# Patient Record
Sex: Male | Born: 2000 | Race: Black or African American | Hispanic: No | Marital: Single | Smoking: Never smoker
Health system: Southern US, Community
[De-identification: ages and names within clinical notes are randomized; demographics above are authoritative.]

## PROBLEM LIST (undated history)

## (undated) DIAGNOSIS — T7840XA Allergy, unspecified, initial encounter: Secondary | ICD-10-CM

## (undated) DIAGNOSIS — J45909 Unspecified asthma, uncomplicated: Secondary | ICD-10-CM

## (undated) HISTORY — DX: Unspecified asthma, uncomplicated: J45.909

## (undated) HISTORY — DX: Allergy, unspecified, initial encounter: T78.40XA

---
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
## 2020-12-14 ENCOUNTER — Other Ambulatory Visit: Payer: Self-pay

## 2020-12-14 ENCOUNTER — Encounter: Payer: Self-pay | Admitting: Family Medicine

## 2020-12-14 ENCOUNTER — Ambulatory Visit: Payer: Commercial Managed Care - PPO | Admitting: Family Medicine

## 2020-12-14 VITALS — BP 122/64 | HR 84 | Ht 67.0 in | Wt 125.0 lb

## 2020-12-14 DIAGNOSIS — Z7689 Persons encountering health services in other specified circumstances: Secondary | ICD-10-CM | POA: Diagnosis not present

## 2020-12-14 DIAGNOSIS — F5101 Primary insomnia: Secondary | ICD-10-CM

## 2020-12-14 MED ORDER — HYDROXYZINE HCL 10 MG PO TABS: 10.0000 mg | ORAL_TABLET | Freq: Every evening | ORAL | 11 refills | Status: DC | PRN

## 2020-12-14 NOTE — Progress Notes (Signed)
Date:  12/14/2020   Name:  Evan Webb   DOB:  [birth year]   MRN:  [id number]   Chief Complaint: Establish Care and Insomnia (Can't sleep at night- tried melatonin- helps some but "not consistently")  Patient is a 20 year old male who presents for a establish care exam. The patient reports the following problems: insomnia. Health maintenance has been reviewed up to date.    Insomnia Primary symptoms: difficulty falling asleep.   The current episode started one month. The onset quality is gradual. The problem occurs intermittently. The problem has been rapidly improving since onset. The symptoms are relieved by medication.   No results found for: CREATININE, BUN, NA, K, CL, CO2 No results found for: CHOL, HDL, LDLCALC, LDLDIRECT, TRIG, CHOLHDL No results found for: TSH No results found for: HGBA1C No results found for: WBC, HGB, HCT, MCV, PLT No results found for: ALT, AST, GGT, ALKPHOS, BILITOT   Review of Systems  Constitutional:  Negative for chills and fever.  HENT:  Negative for drooling, ear discharge, ear pain and sore throat.   Respiratory:  Negative for cough, shortness of breath and wheezing.   Cardiovascular:  Negative for chest pain, palpitations and leg swelling.  Gastrointestinal:  Negative for abdominal pain, blood in stool, constipation, diarrhea and nausea.  Endocrine: Negative for polydipsia.  Genitourinary:  Negative for dysuria, frequency, hematuria and urgency.  Musculoskeletal:  Negative for back pain, myalgias and neck pain.  Skin:  Negative for rash.  Allergic/Immunologic: Negative for environmental allergies.  Neurological:  Negative for dizziness and headaches.  Hematological:  Does not bruise/bleed easily.  Psychiatric/Behavioral:  Negative for suicidal ideas. The patient has insomnia. The patient is not nervous/anxious.    There are no problems to display for this patient.   No Known Allergies  History reviewed. No pertinent surgical  history.  Social History   Tobacco Use   Smoking status: Never   Smokeless tobacco: Never  Vaping Use   Vaping Use: Never used  Substance Use Topics   Alcohol use: Not Currently   Drug use: Never     Medication list has been reviewed and updated.  No outpatient medications have been marked as taking for the 12/14/20 encounter (Office Visit) with Duanne Limerick, MD.    [facility] 2/9 Scores 12/14/2020  PHQ - 2 Score 1  PHQ- 9 Score 4    GAD 7 : Generalized Anxiety Score 12/14/2020  Nervous, Anxious, on Edge 0  Control/stop worrying 1  Worry too much - different things 0  Trouble relaxing 0  Restless 0  Easily annoyed or irritable 2  Afraid - awful might happen 1  Total GAD 7 Score 4  Anxiety Difficulty Somewhat difficult    BP Readings from Last 3 Encounters:  12/14/20 122/64    Physical Exam Vitals reviewed.  HENT:     Head: Normocephalic.     Right Ear: External ear normal.     Left Ear: External ear normal.     Nose: Nose normal.  Eyes:     General: No scleral icterus.       Right eye: No discharge.        Left eye: No discharge.     Conjunctiva/sclera: Conjunctivae normal.     Pupils: Pupils are equal, round, and reactive to light.  Neck:     Thyroid: No thyromegaly.     Vascular: No JVD.     Trachea: No tracheal deviation.  Cardiovascular:  Rate and Rhythm: Normal rate and regular rhythm.     Heart sounds: Normal heart sounds. No murmur heard.   No friction rub. No gallop.  Pulmonary:     Effort: No respiratory distress.     Breath sounds: Normal breath sounds. No wheezing or rales.  Abdominal:     General: Bowel sounds are normal.     Palpations: Abdomen is soft. There is no mass.     Tenderness: There is no abdominal tenderness. There is no guarding or rebound.  Musculoskeletal:        General: No tenderness. Normal range of motion.     Cervical back: Normal range of motion and neck supple.  Lymphadenopathy:     Cervical: No cervical  adenopathy.  Skin:    General: Skin is warm.     Findings: No rash.  Neurological:     Mental Status: He is alert and oriented to person, place, and time.     Cranial Nerves: No cranial nerve deficit.     Deep Tendon Reflexes: Reflexes are normal and symmetric.    Wt Readings from Last 3 Encounters:  12/14/20 125 lb (56.7 kg) (7 %, Z= -1.50)*   * Growth percentiles are based on CDC (Boys, 2-20 Years) data.    BP 122/64   Pulse 84   Ht 5\' 7"  (1.702 m)   Wt 125 lb (56.7 kg)   BMI 19.58 kg/m   Assessment and Plan:  1. Primary insomnia Chronic.  Uncontrolled.  Stable.  Patient has a history of primary insomnia for which she has been taking over-the-counter melatonin with mixed results.  Patient is a at [employer] and we want to avoid medications that could have lingering cognitive concerns.  We will go with low-dose hydroxyzine since he does have a history of allergic rhinitis allergic dermatitis and allergic conjunctivitis.  We will start with 1/2 to 1 tablet of a 10 mg nightly and will therefore. - hydrOXYzine (ATARAX/VISTARIL) 10 MG tablet; Take 1 tablet (10 mg total) by mouth at bedtime as needed.  Dispense: 30 tablet; Refill: 11  2. Establishing care with new doctor, encounter for Patient establishing care with new physician.

## 2020-12-14 NOTE — Patient Instructions (Signed)
Insomnia Insomnia is a sleep disorder that makes it difficult to fall asleep or stay asleep. Insomnia can cause fatigue, low energy, difficulty concentrating, moodswings, and poor performance at work or school. There are three different ways to classify insomnia: Difficulty falling asleep. Difficulty staying asleep. Waking up too early in the morning. Any type of insomnia can be long-term (chronic) or short-term (acute). Both are common. Short-term insomnia usually lasts for three months or less. Chronic insomnia occurs at least three times a week for longer than threemonths. What are the causes? Insomnia may be caused by another condition, situation, or substance, such as: Anxiety. Certain medicines. Gastroesophageal reflux disease (GERD) or other gastrointestinal conditions. Asthma or other breathing conditions. Restless legs syndrome, sleep apnea, or other sleep disorders. Chronic pain. Menopause. Stroke. Abuse of alcohol, tobacco, or illegal drugs. Mental health conditions, such as depression. Caffeine. Neurological disorders, such as Alzheimer's disease. An overactive thyroid (hyperthyroidism). Sometimes, the cause of insomnia may not be known. What increases the risk? Risk factors for insomnia include: Gender. Women are affected more often than men. Age. Insomnia is more common as you get older. Stress. Lack of exercise. Irregular work schedule or working night shifts. Traveling between different time zones. Certain medical and mental health conditions. What are the signs or symptoms? If you have insomnia, the main symptom is having trouble falling asleep or having trouble staying asleep. This may lead to other symptoms, such as: Feeling fatigued or having low energy. Feeling nervous about going to sleep. Not feeling rested in the morning. Having trouble concentrating. Feeling irritable, anxious, or depressed. How is this diagnosed? This condition may be diagnosed based  on: Your symptoms and medical history. Your health care provider may ask about: Your sleep habits. Any medical conditions you have. Your mental health. A physical exam. How is this treated? Treatment for insomnia depends on the cause. Treatment may focus on treating an underlying condition that is causing insomnia. Treatment may also include: Medicines to help you sleep. Counseling or therapy. Lifestyle adjustments to help you sleep better. Follow these instructions at home: Eating and drinking  Limit or avoid alcohol, caffeinated beverages, and cigarettes, especially close to bedtime. These can disrupt your sleep. Do not eat a large meal or eat spicy foods right before bedtime. This can lead to digestive discomfort that can make it hard for you to sleep.  Sleep habits  Keep a sleep diary to help you and your health care provider figure out what could be causing your insomnia. Write down: When you sleep. When you wake up during the night. How well you sleep. How rested you feel the next day. Any side effects of medicines you are taking. What you eat and drink. Make your bedroom a dark, comfortable place where it is easy to fall asleep. Put up shades or blackout curtains to block light from outside. Use a white noise machine to block noise. Keep the temperature cool. Limit screen use before bedtime. This includes: Watching TV. Using your smartphone, tablet, or computer. Stick to a routine that includes going to bed and waking up at the same times every day and night. This can help you fall asleep faster. Consider making a quiet activity, such as reading, part of your nighttime routine. Try to avoid taking naps during the day so that you sleep better at night. Get out of bed if you are still awake after 15 minutes of trying to sleep. Keep the lights down, but try reading or doing a quiet   activity. When you feel sleepy, go back to bed.  General instructions Take over-the-counter  and prescription medicines only as told by your health care provider. Exercise regularly, as told by your health care provider. Avoid exercise starting several hours before bedtime. Use relaxation techniques to manage stress. Ask your health care provider to suggest some techniques that may work well for you. These may include: Breathing exercises. Routines to release muscle tension. Visualizing peaceful scenes. Make sure that you drive carefully. Avoid driving if you feel very sleepy. Keep all follow-up visits as told by your health care provider. This is important. Contact a health care provider if: You are tired throughout the day. You have trouble in your daily routine due to sleepiness. You continue to have sleep problems, or your sleep problems get worse. Get help right away if: You have serious thoughts about hurting yourself or someone else. If you ever feel like you may hurt yourself or others, or have thoughts about taking your own life, get help right away. You can go to your nearest emergency department or call: Your local emergency services (911 in the [place] A suicide crisis helpline, such as the National Suicide Prevention Lifeline at 1-800-273-8255. This is open 24 hours a day. Summary Insomnia is a sleep disorder that makes it difficult to fall asleep or stay asleep. Insomnia can be long-term (chronic) or short-term (acute). Treatment for insomnia depends on the cause. Treatment may focus on treating an underlying condition that is causing insomnia. Keep a sleep diary to help you and your health care provider figure out what could be causing your insomnia. This information is not intended to replace advice given to you by your health care provider. Make sure you discuss any questions you have with your healthcare provider. Document Revised: 04/27/2020 Document Reviewed: 04/27/2020 Elsevier Patient Education  2022 [employer]  

## 2021-12-26 ENCOUNTER — Encounter: Payer: Self-pay | Admitting: Family Medicine

## 2021-12-26 ENCOUNTER — Ambulatory Visit (INDEPENDENT_AMBULATORY_CARE_PROVIDER_SITE_OTHER): Payer: Commercial Managed Care - PPO | Admitting: Family Medicine

## 2021-12-26 VITALS — BP 120/80 | HR 64 | Ht 67.0 in | Wt 140.0 lb

## 2021-12-26 DIAGNOSIS — F419 Anxiety disorder, unspecified: Secondary | ICD-10-CM | POA: Diagnosis not present

## 2021-12-26 DIAGNOSIS — F32A Depression, unspecified: Secondary | ICD-10-CM | POA: Diagnosis not present

## 2021-12-26 DIAGNOSIS — Z0000 Encounter for general adult medical examination without abnormal findings: Secondary | ICD-10-CM

## 2021-12-26 NOTE — Progress Notes (Signed)
Date:  12/26/2021   Name:  Evan Webb   DOB:  [birth year]   MRN:  [id number]   Chief Complaint: Annual Exam and Anxiety  Patient is a 21 year old male who presents for a comprehensive physical exam. The patient reports the following problems: anxiety. Health maintenance has been reviewed up to date.    Anxiety Patient reports no chest pain, dizziness, nausea, nervous/anxious behavior, palpitations, shortness of breath or suicidal ideas.      No results found for: "NA", "K", "CO2", "GLUCOSE", "BUN", "CREATININE", "CALCIUM", "EGFR", "GFRNONAA" No results found for: "CHOL", "HDL", "LDLCALC", "LDLDIRECT", "TRIG", "CHOLHDL" No results found for: "TSH" No results found for: "HGBA1C" No results found for: "WBC", "HGB", "HCT", "MCV", "PLT" No results found for: "ALT", "AST", "GGT", "ALKPHOS", "BILITOT" No results found for: "25OHVITD2", "25OHVITD3", "VD25OH"   Review of Systems  Constitutional:  Negative for chills and fever.  HENT:  Negative for drooling, ear discharge, ear pain and sore throat.   Respiratory:  Negative for cough, shortness of breath and wheezing.   Cardiovascular:  Negative for chest pain, palpitations and leg swelling.  Gastrointestinal:  Negative for abdominal pain, blood in stool, constipation, diarrhea and nausea.  Endocrine: Negative for polydipsia.  Genitourinary:  Negative for dysuria, frequency, hematuria and urgency.  Musculoskeletal:  Negative for back pain, myalgias and neck pain.  Skin:  Negative for rash.  Allergic/Immunologic: Negative for environmental allergies.  Neurological:  Negative for dizziness and headaches.  Hematological:  Does not bruise/bleed easily.  Psychiatric/Behavioral:  Negative for self-injury and suicidal ideas. The patient is not nervous/anxious.        Anxiety    There are no problems to display for this patient.   No Known Allergies  No past surgical history on file.  Social History   Tobacco Use   Smoking status:  Never   Smokeless tobacco: Never  Vaping Use   Vaping Use: Never used  Substance Use Topics   Alcohol use: Not Currently   Drug use: Never     Medication list has been reviewed and updated.  Current Meds  Medication Sig   albuterol (VENTOLIN HFA) 108 (90 Base) MCG/ACT inhaler    fexofenadine (ALLEGRA) 180 MG tablet Take 1 tablet by mouth as needed. otc   mometasone (NASONEX) 50 MCG/ACT nasal spray otc       12/26/2021   11:14 AM 12/14/2020    2:51 PM  GAD 7 : Generalized Anxiety Score  Nervous, Anxious, on Edge 2 0  Control/stop worrying 2 1  Worry too much - different things 2 0  Trouble relaxing 3 0  Restless 2 0  Easily annoyed or irritable 1 2  Afraid - awful might happen 3 1  Total GAD 7 Score 15 4  Anxiety Difficulty Very difficult Somewhat difficult       12/26/2021   11:14 AM 12/14/2020    2:50 PM  Depression screen PHQ 2/9  Decreased Interest 1 0  Down, Depressed, Hopeless 3 1  PHQ - 2 Score 4 1  Altered sleeping 1 2  Tired, decreased energy 1 1  Change in appetite 0 0  Feeling bad or failure about yourself  0 0  Trouble concentrating 2 0  Moving slowly or fidgety/restless 1 0  Suicidal thoughts 1 0  PHQ-9 Score 10 4  Difficult doing work/chores Very difficult Not difficult at all    BP Readings from Last 3 Encounters:  12/26/21 120/80  12/14/20 122/64    Physical  Exam Vitals and nursing note reviewed.  HENT:     Head: Normocephalic.     Jaw: There is normal jaw occlusion.     Right Ear: Hearing, tympanic membrane, ear canal and external ear normal. There is no impacted cerumen.     Left Ear: Hearing, tympanic membrane, ear canal and external ear normal. There is no impacted cerumen.     Nose: Nose normal. No congestion or rhinorrhea.     Right Sinus: No maxillary sinus tenderness or frontal sinus tenderness.     Left Sinus: No frontal sinus tenderness.     Mouth/Throat:     Mouth: Mucous membranes are moist.     Tongue: No lesions. Tongue  does not deviate from midline.     Pharynx: Oropharynx is clear. Uvula midline.     Tonsils: No tonsillar exudate or tonsillar abscesses.  Eyes:     General: Lids are normal. Vision grossly intact. Gaze aligned appropriately. No scleral icterus.       Right eye: No discharge.        Left eye: No discharge.     Extraocular Movements: Extraocular movements intact.     Conjunctiva/sclera: Conjunctivae normal.     Pupils: Pupils are equal, round, and reactive to light.     Funduscopic exam:    Right eye: Red reflex present.        Left eye: Red reflex present. Neck:     Thyroid: No thyromegaly.     Vascular: Normal carotid pulses. No carotid bruit, hepatojugular reflux or JVD.     Trachea: Trachea and phonation normal. No tracheal deviation.  Cardiovascular:     Rate and Rhythm: Normal rate and regular rhythm.     Chest Wall: PMI is not displaced. No thrill.     Pulses: Normal pulses.          Carotid pulses are 2+ on the right side and 2+ on the left side.      Radial pulses are 2+ on the right side and 2+ on the left side.       Femoral pulses are 2+ on the right side and 2+ on the left side.      Popliteal pulses are 2+ on the right side and 2+ on the left side.       Dorsalis pedis pulses are 2+ on the right side and 2+ on the left side.       Posterior tibial pulses are 2+ on the right side and 2+ on the left side.     Heart sounds: Normal heart sounds, S1 normal and S2 normal. No murmur heard.    No systolic murmur is present.     No diastolic murmur is present.     No friction rub. No gallop. No S3 or S4 sounds.  Pulmonary:     Effort: No respiratory distress.     Breath sounds: Normal breath sounds. No decreased breath sounds, wheezing, rhonchi or rales.  Chest:     Chest wall: No tenderness.  Breasts:    Right: Normal.     Left: Normal.  Abdominal:     General: Bowel sounds are normal.     Palpations: Abdomen is soft. There is no hepatomegaly, splenomegaly or mass.      Tenderness: There is no abdominal tenderness. There is no right CVA tenderness, left CVA tenderness, guarding or rebound.  Genitourinary:    Pubic Area: No rash or pubic lice.      Penis: Normal  and circumcised.      Testes: Normal.        Right: Mass not present.        Left: Mass not present.  Musculoskeletal:        General: No tenderness. Normal range of motion.     Cervical back: Normal, normal range of motion and neck supple.     Thoracic back: Normal.     Lumbar back: Normal.     Right lower leg: No edema.     Left lower leg: No edema.  Lymphadenopathy:     Head:     Right side of head: No submental or submandibular adenopathy.     Left side of head: No submental or submandibular adenopathy.     Cervical: No cervical adenopathy.     Right cervical: No superficial, deep or posterior cervical adenopathy.    Left cervical: No superficial, deep or posterior cervical adenopathy.     Upper Body:     Right upper body: No supraclavicular, axillary or pectoral adenopathy.     Left upper body: No supraclavicular, axillary or pectoral adenopathy.     Lower Body: No right inguinal adenopathy. No left inguinal adenopathy.  Skin:    General: Skin is warm.     Findings: No rash.     Comments: Areas of hypopigmentation  Neurological:     Mental Status: He is alert and oriented to person, place, and time.     Cranial Nerves: Cranial nerves 2-12 are intact. No cranial nerve deficit.     Sensory: Sensation is intact.     Motor: Motor function is intact.     Deep Tendon Reflexes: Reflexes are normal and symmetric.     Wt Readings from Last 3 Encounters:  12/26/21 140 lb (63.5 kg)  12/14/20 125 lb (56.7 kg) (7 %, Z= -1.50)*   * Growth percentiles are based on CDC (Boys, 2-20 Years) data.    BP 120/80   Pulse 64   Ht 5' 7" (1.702 m)   Wt 140 lb (63.5 kg)   BMI 21.93 kg/m   Assessment and Plan:  Evan Webb is a 21 y.o. male who presents today for his Complete Annual Exam. He  feels well physically. He reports exercising some. He reports he is sleeping .fairly well.   Patient's chart was reviewed for most recent encounters most recent imaging most recent labs and Care Everywhere. 1. Annual physical exam Immunizations are reviewed and recommendations provided.   Age appropriate screening tests are discussed. Counseling given for risk factor reduction interventions.  No subjective/objective concerns other than noting anxiety and depression on history of present illness, review of systems, or physical exam.  2. Anxiety and depression Chronic.  Persistent.  Is followed by a therapist but there is some concern about needing to be on medication.  My preference given the age is to refer to psychiatry for evaluation and determination of therapeutic choice.  Patient is leaving for [place] so preferably this will need to be in order over the next 6 to 8 weeks.  Referral has been placed with psychiatry

## 2021-12-27 LAB — RENAL FUNCTION PANEL
Albumin: 4.4 g/dL (ref 4.1–5.2)
BUN/Creatinine Ratio: 20 (ref 9–20)
BUN: 21 mg/dL — ABNORMAL HIGH (ref 6–20)
CO2: 25 mmol/L (ref 20–29)
Calcium: 9.7 mg/dL (ref 8.7–10.2)
Chloride: 105 mmol/L (ref 96–106)
Creatinine, Ser: 1.06 mg/dL (ref 0.76–1.27)
Glucose: 83 mg/dL (ref 70–99)
Phosphorus: 4.3 mg/dL — ABNORMAL HIGH (ref 2.8–4.1)
Potassium: 4.3 mmol/L (ref 3.5–5.2)
Sodium: 143 mmol/L (ref 134–144)
eGFR: 103 mL/min/{1.73_m2} (ref >59)

## 2021-12-27 LAB — LIPID PANEL WITH LDL/HDL RATIO
Cholesterol, Total: 153 mg/dL (ref 100–199)
HDL: 49 mg/dL (ref >39)
LDL Chol Calc (NIH): 93 mg/dL (ref 0–99)
LDL/HDL Ratio: 1.9 ratio (ref 0.0–3.6)
Triglycerides: 53 mg/dL (ref 0–149)
VLDL Cholesterol Cal: 11 mg/dL (ref 5–40)

## 2022-12-31 ENCOUNTER — Encounter: Payer: Commercial Managed Care - PPO | Admitting: Family Medicine

## 2023-05-28 ENCOUNTER — Encounter: Payer: Self-pay | Admitting: Family Medicine

## 2023-06-12 ENCOUNTER — Encounter: Payer: Self-pay | Admitting: Family Medicine

## 2023-06-12 ENCOUNTER — Ambulatory Visit: Payer: Commercial Managed Care - PPO | Admitting: Family Medicine

## 2023-06-12 VITALS — BP 100/78 | HR 85 | Ht 67.0 in | Wt 140.0 lb

## 2023-06-12 DIAGNOSIS — N451 Epididymitis: Secondary | ICD-10-CM

## 2023-06-12 DIAGNOSIS — Z23 Encounter for immunization: Secondary | ICD-10-CM

## 2023-06-12 MED ORDER — DOXYCYCLINE HYCLATE 100 MG PO TABS: 100.0000 mg | ORAL_TABLET | Freq: Two times a day (BID) | ORAL | 0 refills | Status: DC

## 2023-06-12 NOTE — Progress Notes (Signed)
Date:  06/12/2023   Name:  Evan Webb   DOB:  05/04/2001   MRN:  [id number]   Chief Complaint: Testicle Pain (X1 week, dull aching pain, hurt when walking, has gotten better, no redness or swelling )  Testicle Pain The patient's primary symptoms include scrotal swelling and testicular pain. The patient's pertinent negatives include no genital injury, genital itching, genital lesions, pelvic pain, penile discharge or penile pain. This is a new problem. The current episode started 1 to 4 weeks ago (10 days). The problem occurs rarely. The problem has been gradually improving. The patient is experiencing no pain. Pertinent negatives include no abdominal pain, chest pain, chills, diarrhea, dysuria, fever, flank pain, frequency, hematuria, hesitancy or urgency. Associated symptoms comments: Earlier dyscharge.    Lab Results  Component Value Date   NA 143 12/26/2021   K 4.3 12/26/2021   CO2 25 12/26/2021   GLUCOSE 83 12/26/2021   BUN 21 (H) 12/26/2021   CREATININE 1.06 12/26/2021   CALCIUM 9.7 12/26/2021   EGFR 103 12/26/2021   Lab Results  Component Value Date   CHOL 153 12/26/2021   HDL 49 12/26/2021   LDLCALC 93 12/26/2021   TRIG 53 12/26/2021   No results found for: "TSH" No results found for: "HGBA1C" No results found for: "WBC", "HGB", "HCT", "MCV", "PLT" No results found for: "ALT", "AST", "GGT", "ALKPHOS", "BILITOT" No results found for: "25OHVITD2", "25OHVITD3", "VD25OH"   Review of Systems  Constitutional:  Negative for chills and fever.  Cardiovascular:  Negative for chest pain and palpitations.  Gastrointestinal:  Negative for abdominal pain and diarrhea.  Genitourinary:  Positive for scrotal swelling and testicular pain. Negative for difficulty urinating, dysuria, enuresis, flank pain, frequency, genital sores, hematuria, hesitancy, pelvic pain, penile discharge, penile pain, penile swelling and urgency.       No penile dicharge    There are no active problems  to display for this patient.   No Known Allergies  History reviewed. No pertinent surgical history.  Social History   Tobacco Use   Smoking status: Never   Smokeless tobacco: Never  Vaping Use   Vaping status: Never Used  Substance Use Topics   Alcohol use: Not Currently   Drug use: Never     Medication list has been reviewed and updated.  Current Meds  Medication Sig   albuterol (VENTOLIN HFA) 108 (90 Base) MCG/ACT inhaler    fexofenadine (ALLEGRA) 180 MG tablet Take 1 tablet by mouth as needed. otc   hydrocortisone cream 1 % Apply 1 Application topically 2 (two) times daily.   mometasone (NASONEX) 50 MCG/ACT nasal spray otc       06/12/2023    2:19 PM 12/26/2021   11:14 AM 12/14/2020    2:51 PM  GAD 7 : Generalized Anxiety Score  Nervous, Anxious, on Edge 0 2 0  Control/stop worrying 0 2 1  Worry too much - different things 0 2 0  Trouble relaxing 0 3 0  Restless 0 2 0  Easily annoyed or irritable 0 1 2  Afraid - awful might happen 0 3 1  Total GAD 7 Score 0 15 4  Anxiety Difficulty Not difficult at all Very difficult Somewhat difficult       06/12/2023    2:19 PM 12/26/2021   11:14 AM 12/14/2020    2:50 PM  Depression screen PHQ 2/9  Decreased Interest 0 1 0  Down, Depressed, Hopeless 0 3 1  PHQ - 2 Score 0  4 1  Altered sleeping 0 1 2  Tired, decreased energy 2 1 1   Change in appetite 1 0 0  Feeling bad or failure about yourself  0 0 0  Trouble concentrating 1 2 0  Moving slowly or fidgety/restless 0 1 0  Suicidal thoughts 0 1 0  PHQ-9 Score 4 10 4   Difficult doing work/chores Not difficult at all Very difficult Not difficult at all    BP Readings from Last 3 Encounters:  06/12/23 100/78  12/26/21 120/80  12/14/20 122/64    Physical Exam Vitals and nursing note reviewed.  HENT:     Head: Normocephalic.     Right Ear: Tympanic membrane and external ear normal.     Left Ear: Tympanic membrane and external ear normal.     Nose: Nose normal.   Eyes:     General: No scleral icterus.       Right eye: No discharge.        Left eye: No discharge.     Conjunctiva/sclera: Conjunctivae normal.     Pupils: Pupils are equal, round, and reactive to light.  Neck:     Thyroid: No thyromegaly.     Vascular: No JVD.     Trachea: No tracheal deviation.  Cardiovascular:     Rate and Rhythm: Normal rate and regular rhythm.     Heart sounds: Normal heart sounds. No murmur heard.    No friction rub. No gallop.  Pulmonary:     Effort: No respiratory distress.     Breath sounds: Normal breath sounds. No wheezing, rhonchi or rales.  Abdominal:     General: Bowel sounds are normal.     Palpations: Abdomen is soft. There is no mass.     Tenderness: There is no abdominal tenderness. There is no guarding or rebound.  Genitourinary:    Testes:        Right: Tenderness and swelling present. Mass, testicular hydrocele or varicocele not present.        Left: Mass, tenderness, swelling, testicular hydrocele or varicocele not present.     Comments: Tender and swelling R epidydimis Musculoskeletal:     Cervical back: Normal range of motion and neck supple.  Lymphadenopathy:     Cervical: No cervical adenopathy.  Neurological:     Mental Status: He is alert.     Deep Tendon Reflexes: Reflexes are normal and symmetric.     Wt Readings from Last 3 Encounters:  12/26/21 140 lb (63.5 kg)  12/14/20 125 lb (56.7 kg) (7%, Z= -1.50)*   * Growth percentiles are based on CDC (Boys, 2-20 Years) data.    BP 100/78   Pulse 85   Ht 5\' 7"  (1.702 m)   SpO2 100%   BMI 21.93 kg/m   Assessment and Plan:  1. Epididymitis, right (Primary) New onset.  Gradually improving but still persistent symptomatology and tenderness of the right epididymis.  This is consistent with a subacute resolving acute epididymitis and we will treat with doxycycline 100 mg twice a day.  Even though patient has been not sexually active I would prefer to go in this direction.   Patient is to return if continued symptomatology. - doxycycline (VIBRA-TABS) 100 MG tablet; Take 1 tablet (100 mg total) by mouth 2 (two) times daily.  Dispense: 20 tablet; Refill: 0  2. Need for influenza vaccination Discussed and administered - Flu vaccine trivalent PF, 6mos and older(Flulaval,Afluria,Fluarix,Fluzone)    Elizabeth Sauer, MD

## 2023-06-12 NOTE — Patient Instructions (Signed)
Epididymitis  Epididymitis is inflammation or swelling of the epididymis. This is caused by an infection. The epididymis is a cord-like structure that is located along the top and back part of the testicle. It collects and stores sperm from the testicle. This condition can also cause pain and swelling of the testicle and scrotum. Symptoms usually start suddenly (acute epididymitis). Sometimes epididymitis starts gradually and lasts for a while (chronic epididymitis). Chronic epididymitis may be harder to treat. What are the causes? In men ages 1-40, this condition is usually caused by a bacterial infection or a sexually transmitted infection (STI), such as gonorrhea or chlamydia. In men 33 and older, this condition is usually caused by bacteria from a urinary blockage or from abnormalities in the urinary system. These can result from: Having a tube placed into the bladder (urinary catheter). Having an enlarged or inflamed prostate gland. Having recently had urinary tract surgery. Having a problem with a backward flow of urine (retrograde). In men who have a condition that weakens the body's defense system (immune system), such as human immunodeficiency virus (HIV), this condition can be caused by: Other bacteria, including tuberculosis and syphilis. Viruses. Fungi. Sometimes this condition occurs without infection. This may happen because of trauma or repetitive activities such as sports. What increases the risk? You are more likely to develop this condition if you have: Unprotected sex with more than one partner. Anal sex. Had recent surgery. A urinary catheter. Urinary problems. A suppressed immune system. What are the signs or symptoms? This condition usually begins suddenly with chills, fever, and pain behind the scrotum and in the testicle. Other symptoms include: Swelling of the scrotum, testicle, or both. Pain when ejaculating or urinating. Pain in the back or  abdomen. Nausea. Itching and discharge from the penis. A frequent need to pass urine. Redness, increased warmth, and tenderness of the scrotum. How is this diagnosed? Your health care provider can diagnose this condition based on your symptoms and medical history. Your health care provider will also do a physical exam to check your scrotum and testicle for swelling, pain, and redness. You may also have other tests, including: Testing of discharge from the penis. Testing your urine for infections, such as STIs. Ultrasound to check for blood flow and inflammation. Your health care provider may test you for other STIs, including HIV. How is this treated? Treatment for this condition depends on the cause. If your condition is caused by a bacterial infection, oral antibiotic medicine may be prescribed. If the bacterial infection has spread to your blood, you may need to receive IV antibiotics. For both bacterial and nonbacterial epididymitis, you may be treated with: Rest. Elevation of the scrotum. Pain medicines. Anti-inflammatory medicines. Surgery may be needed if: You have pus buildup in the scrotum (abscess). You have epididymitis that has not responded to other treatments. Follow these instructions at home: Medicines Take over-the-counter and prescription medicines only as told by your health care provider. If you were prescribed an antibiotic medicine, take it as told by your health care provider. Do not stop taking the antibiotic even if your condition improves. Sexual activity If your epididymitis was caused by an STI, avoid sexual activity until your treatment is complete. Inform your sexual partner or partners if you test positive for an STI. They may need to be treated. Do not engage in sexual activity with your partner or partners until their treatment is completed. Managing pain and swelling  If directed, raise (elevate) your scrotum and apply ice.  To do this: Put ice in a  plastic bag. Place a small towel or pillow between your legs. Rest your scrotum on the pillow or towel. Place another towel between your skin and the plastic bag. Leave the ice on for 20 minutes, 2-3 times a day. Remove the ice if your skin turns bright red. This is very important. If you cannot feel pain, heat, or cold, you have a greater risk of damage to the area. Keep your scrotum elevated and supported while resting. Ask your health care provider if you should wear a scrotal support, such as a jockstrap. Wear it as told by your health care provider. Try taking a sitz bath to help with discomfort. This is a warm water bath that is taken while you are sitting down. The water should come up to your hips and should cover your buttocks. Do this 3-4 times per day or as told by your health care provider. General instructions Drink enough fluid to keep your urine pale yellow. Return to your normal activities as told by your health care provider. Ask your health care provider what activities are safe for you. Keep all follow-up visits. This is important. Contact a health care provider if: You have a fever. Your pain medicine is not helping. Your pain is getting worse. Your symptoms do not improve within 3 days. Summary Epididymitis is inflammation or swelling of the epididymis. This is caused by an infection. This condition can also cause pain and swelling of the testicle and scrotum. Treatment for this condition depends on the cause. If your condition is caused by a bacterial infection, oral antibiotic medicine may be prescribed. Inform your sexual partner or partners if you test positive for an STI. They may need to be treated. Do not engage in sexual activity with your partner or partners until their treatment is completed. Contact a health care provider if your symptoms do not improve within 3 days. This information is not intended to replace advice given to you by your health care provider.  Make sure you discuss any questions you have with your health care provider. Document Revised: 01/24/2021 Document Reviewed: 01/24/2021 Elsevier Patient Education  2024 [employer].

## 2023-09-08 ENCOUNTER — Encounter: Payer: Self-pay | Admitting: Family Medicine

## 2023-09-11 ENCOUNTER — Ambulatory Visit (INDEPENDENT_AMBULATORY_CARE_PROVIDER_SITE_OTHER): Admitting: Family Medicine

## 2023-09-11 VITALS — BP 122/78 | HR 65 | Ht 67.0 in | Wt 141.0 lb

## 2023-09-11 DIAGNOSIS — F419 Anxiety disorder, unspecified: Secondary | ICD-10-CM

## 2023-09-11 DIAGNOSIS — F32A Depression, unspecified: Secondary | ICD-10-CM | POA: Diagnosis not present

## 2023-09-11 NOTE — Progress Notes (Signed)
 Date:  09/11/2023   Name:  Evan Webb   DOB:  [birth year]   MRN:  [id number]   Chief Complaint: anxiety and depression  (Wants to test for autism and ADHD - has been on lexapro and hydroxyzine )  Patient is a 23 year old male who presents for a psychological evaluation. The patient reports the following problems: autism,ADHD, depression, and/or anxiety. Health maintenance has been reviewed up to date.    Depression        This is a recurrent problem.  The current episode started more than 1 year ago.   The onset quality is gradual.   The problem has been waxing and waning since onset.  Associated symptoms include decreased concentration, fatigue, insomnia and appetite change.  Associated symptoms include no helplessness, no hopelessness, not irritable, no restlessness, no decreased interest, no body aches, no myalgias, no headaches, no indigestion, not sad and no suicidal ideas.     The symptoms are aggravated by nothing.  Past medical history includes thyroid problem.   Thyroid Problem Presents for initial visit. Symptoms include depressed mood and fatigue. Patient reports no anxiety, cold intolerance, constipation, diaphoresis, diarrhea, dry skin, hair loss, heat intolerance, hoarse voice, leg swelling, nail problem, palpitations, tremors, visual change, weight gain or weight loss. The symptoms have been stable.    Lab Results  Component Value Date   NA 143 12/26/2021   K 4.3 12/26/2021   CO2 25 12/26/2021   GLUCOSE 83 12/26/2021   BUN 21 (H) 12/26/2021   CREATININE 1.06 12/26/2021   CALCIUM 9.7 12/26/2021   EGFR 103 12/26/2021   Lab Results  Component Value Date   CHOL 153 12/26/2021   HDL 49 12/26/2021   LDLCALC 93 12/26/2021   TRIG 53 12/26/2021   No results found for: "TSH" No results found for: "HGBA1C" No results found for: "WBC", "HGB", "HCT", "MCV", "PLT" No results found for: "ALT", "AST", "GGT", "ALKPHOS", "BILITOT" No results found for: "25OHVITD2", "25OHVITD3",  "VD25OH"   Review of Systems  Constitutional:  Positive for appetite change and fatigue. Negative for chills, diaphoresis, fever, weight gain and weight loss.  HENT:  Negative for hoarse voice.   Eyes:  Negative for visual disturbance.  Respiratory:  Negative for chest tightness, shortness of breath and wheezing.   Cardiovascular:  Negative for chest pain and palpitations.  Gastrointestinal:  Negative for abdominal pain, constipation and diarrhea.  Endocrine: Negative for cold intolerance and heat intolerance.  Genitourinary:  Negative for difficulty urinating.  Musculoskeletal:  Negative for myalgias.  Neurological:  Negative for tremors and headaches.  Psychiatric/Behavioral:  Positive for decreased concentration, depression, dysphoric mood and sleep disturbance. Negative for agitation, behavioral problems, confusion, hallucinations, self-injury and suicidal ideas. The patient has insomnia. The patient is not nervous/anxious and is not hyperactive.     There are no active problems to display for this patient.   No Known Allergies  History reviewed. No pertinent surgical history.  Social History   Tobacco Use   Smoking status: Never   Smokeless tobacco: Never  Vaping Use   Vaping status: Never Used  Substance Use Topics   Alcohol use: Not Currently   Drug use: Never     Medication list has been reviewed and updated.  Current Meds  Medication Sig   albuterol (VENTOLIN HFA) 108 (90 Base) MCG/ACT inhaler    fexofenadine (ALLEGRA) 180 MG tablet Take 1 tablet by mouth as needed. otc   hydrocortisone cream 1 % Apply 1 Application  topically 2 (two) times daily.   mometasone (NASONEX) 50 MCG/ACT nasal spray otc   [DISCONTINUED] doxycycline (VIBRA-TABS) 100 MG tablet Take 1 tablet (100 mg total) by mouth 2 (two) times daily.       06/12/2023    2:19 PM 12/26/2021   11:14 AM 12/14/2020    2:51 PM  GAD 7 : Generalized Anxiety Score  Nervous, Anxious, on Edge 0 2 0   Control/stop worrying 0 2 1  Worry too much - different things 0 2 0  Trouble relaxing 0 3 0  Restless 0 2 0  Easily annoyed or irritable 0 1 2  Afraid - awful might happen 0 3 1  Total GAD 7 Score 0 15 4  Anxiety Difficulty Not difficult at all Very difficult Somewhat difficult       06/12/2023    2:19 PM 12/26/2021   11:14 AM 12/14/2020    2:50 PM  Depression screen PHQ 2/9  Decreased Interest 0 1 0  Down, Depressed, Hopeless 0 3 1  PHQ - 2 Score 0 4 1  Altered sleeping 0 1 2  Tired, decreased energy 2 1 1   Change in appetite 1 0 0  Feeling bad or failure about yourself  0 0 0  Trouble concentrating 1 2 0  Moving slowly or fidgety/restless 0 1 0  Suicidal thoughts 0 1 0  PHQ-9 Score 4 10 4   Difficult doing work/chores Not difficult at all Very difficult Not difficult at all    BP Readings from Last 3 Encounters:  09/11/23 122/78  06/12/23 100/78  12/26/21 120/80    Physical Exam Vitals and nursing note reviewed.  Constitutional:      General: He is not irritable.    Appearance: He is well-developed.  HENT:     Head: Normocephalic and atraumatic.     Right Ear: Tympanic membrane, ear canal and external ear normal.     Left Ear: Tympanic membrane, ear canal and external ear normal.     Nose: Nose normal.     Mouth/Throat:     Dentition: Normal dentition.  Eyes:     General: Lids are normal. No scleral icterus.    Conjunctiva/sclera: Conjunctivae normal.     Pupils: Pupils are equal, round, and reactive to light.  Neck:     Thyroid: No thyromegaly.     Vascular: No carotid bruit, hepatojugular reflux or JVD.     Trachea: No tracheal deviation.  Cardiovascular:     Rate and Rhythm: Normal rate and regular rhythm.     Heart sounds: Normal heart sounds.  Pulmonary:     Effort: Pulmonary effort is normal.     Breath sounds: Normal breath sounds. No wheezing, rhonchi or rales.  Chest:     Chest wall: No tenderness.  Abdominal:     General: Bowel sounds are  normal.     Palpations: Abdomen is soft. There is no hepatomegaly, splenomegaly or mass.     Tenderness: There is no abdominal tenderness.     Hernia: There is no hernia in the left inguinal area.  Musculoskeletal:        General: Normal range of motion.     Cervical back: Normal range of motion and neck supple.  Lymphadenopathy:     Cervical: No cervical adenopathy.  Skin:    General: Skin is warm.     Findings: No bruising or erythema.  Neurological:     Mental Status: He is alert and oriented to person,  place, and time.     Deep Tendon Reflexes: Reflexes are normal and symmetric.  Psychiatric:        Mood and Affect: Mood is not anxious or depressed.     Wt Readings from Last 3 Encounters:  09/11/23 141 lb (64 kg)  06/12/23 140 lb (63.5 kg)  12/26/21 140 lb (63.5 kg)    BP 122/78   Pulse 65   Ht 5\' 7"  (1.702 m)   Wt 141 lb (64 kg)   SpO2 99%   BMI 22.08 kg/m   Assessment and Plan: 1. Anxiety and depression (Primary) Chronic.  Episodic.  Waxes and wanes in intensity.  And currently is relatively controlled with a PHQ of 4 and a GAD score of 0.  Patient would like referral to psychiatry for evaluation and treatment of this but also to discuss the possibilities of ADHD/possibility of autism to some extent.  We will place referral to desired behavioral health/psychiatry and patient is to call if he has not heard by Tuesday. - Ambulatory referral to Psychiatry     Elizabeth Sauer, MD

## 2023-09-20 ENCOUNTER — Ambulatory Visit
Admission: EM | Admit: 2023-09-20 | Discharge: 2023-09-20 | Disposition: A | Discharge status: Home / Self Care | Attending: Family Medicine | Admitting: Family Medicine

## 2023-09-20 ENCOUNTER — Encounter: Payer: Self-pay | Admitting: Emergency Medicine

## 2023-09-20 DIAGNOSIS — B09 Unspecified viral infection characterized by skin and mucous membrane lesions: Secondary | ICD-10-CM

## 2023-09-20 MED ORDER — VALACYCLOVIR HCL 1 G PO TABS: 1000.0000 mg | ORAL_TABLET | Freq: Three times a day (TID) | ORAL | 0 refills | Status: AC

## 2023-09-20 MED ORDER — MUPIROCIN 2 % EX OINT: 1.0000 | TOPICAL_OINTMENT | Freq: Two times a day (BID) | CUTANEOUS | 0 refills | Status: AC

## 2023-09-20 NOTE — ED Triage Notes (Signed)
 Pt c/o rash all over his entire body. Pt states " I think I have chicken pox" He states his father has shingles currently. He has single raised, red, itchy spots all over his body. Pt states he has had a subjective fever. He states the spots are also painful. Pt states his parents told him he has had the varicella vaccine and did not have chicken pox as a child.

## 2023-09-20 NOTE — Discharge Instructions (Addendum)
 Staff will call you if the swab is positive for chickenpox/varicella infection.  The results will also go directly to your MyChart.  Take valacyclovir 1000 mg--1 tablet 3 times daily for 7 days  Put mupirocin ointment on the sore areas twice daily until improved   If this is chickenpox it is best to consider yourself contagious until the last crop of lesions is crusting over.  Please follow-up with your primary care for further work return recommendations.

## 2023-09-20 NOTE — ED Provider Notes (Signed)
 [facility]    CSN: [id number] Arrival date & time: 09/20/23  0807      History   Chief Complaint Chief Complaint  Patient presents with   Rash    HPI Evan Webb is a 23 y.o. male.    Rash Here for pruritic vesicular rash that's been bothering him for about 2 days.  He has had some subjective fever and maybe some sore throat.  No cough or congestion.  No vomiting or diarrhea.  About 4 days ago he was around his dad who had shingles.  This patient has had vaccinations for chickenpox as a child, but the last one was understandably about 18 years ago when he was 4 or 5.  NKDA  Past Medical History:  Diagnosis Date   Allergy    Asthma     There are no active problems to display for this patient.   History reviewed. No pertinent surgical history.     Home Medications    Prior to Admission medications   Medication Sig Start Date End Date Taking? Authorizing Provider  albuterol (VENTOLIN HFA) 108 (90 Base) MCG/ACT inhaler    Yes [provider]  fexofenadine (ALLEGRA) 180 MG tablet Take 1 tablet by mouth as needed. otc   Yes [provider]  mometasone (NASONEX) 50 MCG/ACT nasal spray otc 12/04/20  Yes [provider]  mupirocin ointment (BACTROBAN) 2 % Apply 1 Application topically 2 (two) times daily. To affected area till better 09/20/23  Yes Talha Iser, Janace Aris, MD  valACYclovir (VALTREX) 1000 MG tablet Take 1 tablet (1,000 mg total) by mouth 3 (three) times daily for 7 days. 09/20/23 09/27/23 Yes Zenia Resides, MD  hydrocortisone cream 1 % Apply 1 Application topically 2 (two) times daily.    [provider]    Family History Family History  Problem Relation Age of Onset   Cancer Maternal Grandmother     Social History Social History   Tobacco Use   Smoking status: Never   Smokeless tobacco: Never  Vaping Use   Vaping status: Never Used  Substance Use Topics   Alcohol use: Not Currently   Drug use:  Never     Allergies   Patient has no known allergies.   Review of Systems Review of Systems  Skin:  Positive for rash.     Physical Exam Triage Vital Signs ED Triage Vitals  Encounter Vitals Group     BP 09/20/23 0820 123/74     Systolic BP Percentile --      Diastolic BP Percentile --      Pulse Rate 09/20/23 0820 81     Resp 09/20/23 0820 16     Temp 09/20/23 0820 99.1 F (37.3 C)     Temp Source 09/20/23 0820 Oral     SpO2 09/20/23 0820 97 %     Weight 09/20/23 0818 141 lb 1.5 oz (64 kg)     Height 09/20/23 0818 5\' 7"  (1.702 m)     Head Circumference --      Peak Flow --      Pain Score 09/20/23 0818 6     Pain Loc --      Pain Education --      Exclude from Growth Chart --    No data found.  Updated Vital Signs BP 123/74 (BP Location: Right Arm)   Pulse 81   Temp 99.1 F (37.3 C) (Oral)   Resp 16   Ht 5\' 7"  (1.702  m)   Wt 64 kg   SpO2 97%   BMI 22.10 kg/m   Visual Acuity Right Eye Distance:   Left Eye Distance:   Bilateral Distance:    Right Eye Near:   Left Eye Near:    Bilateral Near:     Physical Exam Vitals reviewed.  Constitutional:      General: He is not in acute distress.    Appearance: He is not ill-appearing, toxic-appearing or diaphoretic.  HENT:     Mouth/Throat:     Mouth: Mucous membranes are moist.     Pharynx: No oropharyngeal exudate or posterior oropharyngeal erythema.     Comments: There is 1 vesicular lesion on his left soft palate.  Mucous membranes are otherwise moist and pink  Eyes:     Extraocular Movements: Extraocular movements intact.     Conjunctiva/sclera: Conjunctivae normal.     Pupils: Pupils are equal, round, and reactive to light.  Cardiovascular:     Rate and Rhythm: Normal rate and regular rhythm.     Heart sounds: No murmur heard. Pulmonary:     Effort: Pulmonary effort is normal.     Breath sounds: Normal breath sounds.  Musculoskeletal:     Cervical back: Neck supple.  Lymphadenopathy:      Cervical: No cervical adenopathy.  Skin:    Coloration: Skin is not jaundiced or pale.     Comments: There are scattered vesicular bumps on his face, neck, arm, legs. A couple of lesions on his arm and leg are unroofed. No sign of secondary infection  Neurological:     General: No focal deficit present.     Mental Status: He is alert and oriented to person, place, and time.  Psychiatric:        Behavior: Behavior normal.      UC Treatments / Results  Labs (all labs ordered are listed, but only abnormal results are displayed) Labs Reviewed  VARICELLA-ZOSTER BY PCR    EKG   Radiology No results found.  Procedures Procedures (including critical care time)  Medications Ordered in UC Medications - No data to display  Initial Impression / Assessment and Plan / UC Course  I have reviewed the triage vital signs and the nursing notes.  Pertinent labs & imaging results that were available during my care of the patient were reviewed by me and considered in my medical decision making (see chart for details).     Swab was done for PCR testing on the open lesions.  Valtrex is sent and to treat empirically.  I discussed with him that this could be some other viral illness since incubation period is fairly short of what he is describing.  Work note provided for 3 days.  Of asked him to follow-up with his primary care to get further work return recommendations. Final Clinical Impressions(s) / UC Diagnoses   Final diagnoses:  Viral exanthem     Discharge Instructions      Staff will call you if the swab is positive for chickenpox/varicella infection.  The results will also go directly to your MyChart.  Take valacyclovir 1000 mg--1 tablet 3 times daily for 7 days  Put mupirocin ointment on the sore areas twice daily until improved   If this is chickenpox it is best to consider yourself contagious until the last crop of lesions is crusting over.  Please follow-up with your  primary care for further work return recommendations.     ED Prescriptions  Medication Sig Dispense Auth. Provider   valACYclovir (VALTREX) 1000 MG tablet Take 1 tablet (1,000 mg total) by mouth 3 (three) times daily for 7 days. 21 tablet Nadya Hopwood, Janace Aris, MD   mupirocin ointment (BACTROBAN) 2 % Apply 1 Application topically 2 (two) times daily. To affected area till better 22 g Marlinda Mike Janace Aris, MD      PDMP not reviewed this encounter.   Zenia Resides, MD 09/20/23 [phone]

## 2023-09-23 LAB — VARICELLA-ZOSTER BY PCR: Varicella-Zoster, PCR: POSITIVE — AB

## 2023-10-03 ENCOUNTER — Encounter: Payer: Self-pay | Admitting: Family Medicine

## 2023-10-07 ENCOUNTER — Ambulatory Visit: Admitting: Family Medicine

## 2024-04-27 ENCOUNTER — Ambulatory Visit: Admitting: Family Medicine

## 2024-04-30 ENCOUNTER — Encounter: Payer: Self-pay | Admitting: Family Medicine

## 2024-04-30 ENCOUNTER — Ambulatory Visit: Admitting: Family Medicine

## 2024-04-30 VITALS — BP 118/72 | HR 74 | Ht 67.0 in | Wt 139.0 lb

## 2024-04-30 DIAGNOSIS — Z1159 Encounter for screening for other viral diseases: Secondary | ICD-10-CM | POA: Diagnosis not present

## 2024-04-30 DIAGNOSIS — J4520 Mild intermittent asthma, uncomplicated: Secondary | ICD-10-CM | POA: Insufficient documentation

## 2024-04-30 DIAGNOSIS — Z113 Encounter for screening for infections with a predominantly sexual mode of transmission: Secondary | ICD-10-CM

## 2024-04-30 DIAGNOSIS — Z13220 Encounter for screening for lipoid disorders: Secondary | ICD-10-CM

## 2024-04-30 DIAGNOSIS — Z0000 Encounter for general adult medical examination without abnormal findings: Secondary | ICD-10-CM

## 2024-04-30 DIAGNOSIS — Z136 Encounter for screening for cardiovascular disorders: Secondary | ICD-10-CM

## 2024-04-30 DIAGNOSIS — Z23 Encounter for immunization: Secondary | ICD-10-CM | POA: Diagnosis not present

## 2024-04-30 DIAGNOSIS — J309 Allergic rhinitis, unspecified: Secondary | ICD-10-CM | POA: Insufficient documentation

## 2024-04-30 NOTE — Patient Instructions (Addendum)
 Please go to [facility] or [facility] pharmacy to get MEN B vaccine.  We hope you enjoyed your visit with our office! Your feedback means so much to our team, and it helps us  to continue providing the best care possible. If you had a positive experience, we'd love if you could share it by leaving us  a [employer] Review and also completing our patient survey that you'll receive soon.  Your kind words not only brighten our day but also help other patients feel confident in choosing our office for their care.  Thank you for being a part of our practice family!   Dr. [facility]  Primary Care & Sports Medicine [facility] [address]  [place] [address] Office [phone]  Fax: [phone]'

## 2024-04-30 NOTE — Telephone Encounter (Signed)
 Received

## 2024-04-30 NOTE — Progress Notes (Signed)
 Established Patient Office Visit  Patient ID: Evan Webb, male    DOB: 05/18/2001  Age: 23 y.o. MRN: [id number] PCP: Evan Prien K, MD  Chief Complaint  Patient presents with   Establish Care    Subjective:     HPI  Discussed the use of AI scribe software for clinical note transcription with the patient, who gave verbal consent to proceed.  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  He has a history of asthma, diagnosed at a very young age, and uses an albuterol inhaler as needed. He has not needed to use the inhaler in a long time.  He also has a history of eczema, which he manages with over-the-counter hydrocortisone cream. He mentions using mupirocin  ointment in the past, but currently uses hydrocortisone for skin irritation.  He experiences mild allergic rhinitis and takes Nasonex (mometasone) nasal spray, one spray in each nostril, and Allegra (fexofenadine) tablets, one at bedtime.  He reports a past mental health crisis following the death of his brother, for which he sees a therapist weekly. He describes his depression and anxiety as mild and does not require medication.  No current symptoms or health issues. He is up to date on immunizations, has no significant family history of health issues, lives with his parents, is single, and works at [employer]. He does not smoke or drink alcohol. Sleep is described as decent.     Review of Systems  All other systems reviewed and are negative.     Objective:     BP 118/72   Pulse 74   Ht 5' 7 (1.702 m)   Wt 139 lb (63 kg)   SpO2 98%   BMI 21.77 kg/m     Physical Exam Vitals and nursing note reviewed.  Constitutional:      Appearance: Normal appearance.  HENT:     Head: Normocephalic.     Right Ear: External ear normal.     Left Ear: External ear normal.  Eyes:     Conjunctiva/sclera: Conjunctivae normal.   Cardiovascular:     Rate and Rhythm: Normal rate.  Pulmonary:     Effort: Pulmonary effort is normal. No respiratory distress.  Abdominal:     Palpations: Abdomen is soft.  Musculoskeletal:        General: Normal range of motion.  Skin:    General: Skin is warm.  Neurological:     Mental Status: He is alert and oriented to person, place, and time.  Psychiatric:        Mood and Affect: Mood normal.     Physical Exam     No results found for any visits on 04/30/24.     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 Immunizations current. Mild asthma, eczema, allergic rhinitis, depression, and anxiety noted. No significant family history. - Perform physical examination. - Administer flu shot. - Administer tetanus shot. - Administer HPV 3 shot.  Asthma, mild and well-controlled Asthma well-controlled with as-needed albuterol inhaler, not recently used.  Eczema, mild Mild eczema managed with over-the-counter hydrocortisone cream. -  Continue hydrocortisone cream.  Allergic rhinitis, mild Mild allergic rhinitis controlled with Nasonex and Allegra. - Continue Nasonex and Allegra.  Depression and anxiety, mild and stable Mild depression and anxiety managed with weekly therapy. No medication needed. - Continue weekly therapy sessions.  Screening for lipoid disorders Routine screening as part of annual physical. - Order blood work to check cholesterol levels.  Screening for viral diseases including hepatitis C, HIV, and syphilis Routine screening as part of annual physical. Agreed to HIV and syphilis screening despite low risk. - Order hepatitis C screening. - Order HIV  screening. - Order syphilis screening.    No follow-ups on file.    Evan Webb Evan Vanderberg, MD [facility] at [facility]

## 2024-04-30 NOTE — Addendum Note (Signed)
 Addended by: CAMACHO OCAMPO, Delores Thelen M on: 04/30/2024 04:33 PM   Modules accepted: Orders

## 2024-05-01 LAB — COMPREHENSIVE METABOLIC PANEL WITH GFR
ALT: 12 IU/L (ref 0–44)
AST: 16 IU/L (ref 0–40)
Albumin: 4.4 g/dL (ref 4.3–5.2)
Alkaline Phosphatase: 68 IU/L (ref 47–123)
BUN/Creatinine Ratio: 13 (ref 9–20)
BUN: 16 mg/dL (ref 6–20)
Bilirubin Total: 0.3 mg/dL (ref 0.0–1.2)
CO2: 25 mmol/L (ref 20–29)
Calcium: 9.7 mg/dL (ref 8.7–10.2)
Chloride: 104 mmol/L (ref 96–106)
Creatinine, Ser: 1.2 mg/dL (ref 0.76–1.27)
Globulin, Total: 2.5 g/dL (ref 1.5–4.5)
Glucose: 83 mg/dL (ref 70–99)
Potassium: 4.6 mmol/L (ref 3.5–5.2)
Sodium: 143 mmol/L (ref 134–144)
Total Protein: 6.9 g/dL (ref 6.0–8.5)
eGFR: 87 mL/min/1.73 (ref >59)

## 2024-05-01 LAB — LIPID PANEL
Chol/HDL Ratio: 3.1 ratio (ref 0.0–5.0)
Cholesterol, Total: 146 mg/dL (ref 100–199)
HDL: 47 mg/dL (ref >39)
LDL Chol Calc (NIH): 87 mg/dL (ref 0–99)
Triglycerides: 55 mg/dL (ref 0–149)
VLDL Cholesterol Cal: 12 mg/dL (ref 5–40)

## 2024-05-01 LAB — HIV ANTIBODY (ROUTINE TESTING W REFLEX): HIV Screen 4th Generation wRfx: NONREACTIVE

## 2024-05-01 LAB — RPR: RPR Ser Ql: NONREACTIVE

## 2024-05-01 LAB — CBC WITH DIFFERENTIAL/PLATELET
Basophils Absolute: 0 x10E3/uL (ref 0.0–0.2)
Basos: 1 %
EOS (ABSOLUTE): 0.1 x10E3/uL (ref 0.0–0.4)
Eos: 2 %
Hematocrit: 46.5 % (ref 37.5–51.0)
Hemoglobin: 15.5 g/dL (ref 13.0–17.7)
Immature Grans (Abs): 0 x10E3/uL (ref 0.0–0.1)
Immature Granulocytes: 0 %
Lymphocytes Absolute: 1.2 x10E3/uL (ref 0.7–3.1)
Lymphs: 24 %
MCH: 30.5 pg (ref 26.6–33.0)
MCHC: 33.3 g/dL (ref 31.5–35.7)
MCV: 91 fL (ref 79–97)
Monocytes Absolute: 0.5 x10E3/uL (ref 0.1–0.9)
Monocytes: 10 %
Neutrophils Absolute: 3.3 x10E3/uL (ref 1.4–7.0)
Neutrophils: 63 %
Platelets: 245 x10E3/uL (ref 150–450)
RBC: 5.09 x10E6/uL (ref 4.14–5.80)
RDW: 12.5 % (ref 11.6–15.4)
WBC: 5.1 x10E3/uL (ref 3.4–10.8)

## 2024-05-01 LAB — HCV INTERPRETATION

## 2024-05-01 LAB — HCV AB W REFLEX TO QUANT PCR: HCV Ab: NONREACTIVE

## 2024-05-03 ENCOUNTER — Ambulatory Visit: Payer: Self-pay | Admitting: Family Medicine
